# Patient Record
Sex: Male | Born: 1960 | Race: White | Hispanic: No | Marital: Married | Smoking: Never smoker
Health system: Southern US, Community
[De-identification: ages and names within clinical notes are randomized; demographics above are authoritative.]

## PROBLEM LIST (undated history)

## (undated) DIAGNOSIS — T7840XA Allergy, unspecified, initial encounter: Secondary | ICD-10-CM

## (undated) DIAGNOSIS — K219 Gastro-esophageal reflux disease without esophagitis: Secondary | ICD-10-CM

## (undated) HISTORY — DX: Gastro-esophageal reflux disease without esophagitis: K21.9

## (undated) HISTORY — DX: Allergy, unspecified, initial encounter: T78.40XA

---

## 2012-03-14 ENCOUNTER — Emergency Department (HOSPITAL_COMMUNITY): Payer: Managed Care, Other (non HMO)

## 2012-03-14 ENCOUNTER — Encounter (HOSPITAL_COMMUNITY): Payer: Self-pay | Admitting: Emergency Medicine

## 2012-03-14 ENCOUNTER — Emergency Department (HOSPITAL_COMMUNITY)
Admission: EM | Admit: 2012-03-14 | Discharge: 2012-03-14 | Disposition: A | Discharge status: Home / Self Care | Payer: Managed Care, Other (non HMO) | Attending: Emergency Medicine | Admitting: Emergency Medicine

## 2012-03-14 DIAGNOSIS — Z79899 Other long term (current) drug therapy: Secondary | ICD-10-CM | POA: Insufficient documentation

## 2012-03-14 DIAGNOSIS — M25519 Pain in unspecified shoulder: Secondary | ICD-10-CM | POA: Insufficient documentation

## 2012-03-14 DIAGNOSIS — R110 Nausea: Secondary | ICD-10-CM | POA: Insufficient documentation

## 2012-03-14 DIAGNOSIS — S0990XA Unspecified injury of head, initial encounter: Secondary | ICD-10-CM

## 2012-03-14 DIAGNOSIS — R51 Headache: Secondary | ICD-10-CM | POA: Insufficient documentation

## 2012-03-14 DIAGNOSIS — M25512 Pain in left shoulder: Secondary | ICD-10-CM

## 2012-03-14 MED ORDER — IBUPROFEN 800 MG PO TABS: 800.0000 mg | ORAL_TABLET | Freq: Three times a day (TID) | ORAL | Status: AC

## 2012-03-14 MED ORDER — KETOROLAC TROMETHAMINE 60 MG/2ML IM SOLN
60.0000 mg | Freq: Once | INTRAMUSCULAR | Status: AC
  Administered 2012-03-14: 60 mg via INTRAMUSCULAR
  Filled 2012-03-14: qty 2

## 2012-03-14 MED ORDER — HYDROCODONE-ACETAMINOPHEN 5-325 MG PO TABS
1.0000 | ORAL_TABLET | Freq: Once | ORAL | Status: AC
  Administered 2012-03-14: 1 via ORAL
  Filled 2012-03-14: qty 1

## 2012-03-14 MED ORDER — HYDROCODONE-ACETAMINOPHEN 5-325 MG PO TABS: ORAL_TABLET | ORAL | Status: AC
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
## 2012-03-14 NOTE — Progress Notes (Signed)
Orthopedic Tech Progress Note Patient Details:  Terry Wall 1961/01/24 [id number]  Ortho Devices Type of Ortho Device: Sling immobilizer Ortho Device/Splint Location: right arm Ortho Device/Splint Interventions: Application   Madalynne Gutmann 03/14/2012, 8:10 PM
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
## 2012-03-14 NOTE — ED Notes (Signed)
Paged ortho tech for right shoulder sling.

## 2012-03-14 NOTE — ED Notes (Signed)
Pt in radiology.  Will give meds when pt returns.

## 2012-03-19 ENCOUNTER — Ambulatory Visit: Payer: TRICARE For Life (TFL) | Admitting: Family Medicine

## 2012-03-19 ENCOUNTER — Encounter: Payer: Self-pay | Admitting: Family Medicine

## 2012-03-19 ENCOUNTER — Ambulatory Visit (INDEPENDENT_AMBULATORY_CARE_PROVIDER_SITE_OTHER): Payer: Managed Care, Other (non HMO) | Admitting: Family Medicine

## 2012-03-19 VITALS — BP 136/78 | HR 77 | Temp 98.1°F | Ht 74.0 in | Wt 180.0 lb

## 2012-03-19 DIAGNOSIS — S4980XA Other specified injuries of shoulder and upper arm, unspecified arm, initial encounter: Secondary | ICD-10-CM

## 2012-03-19 DIAGNOSIS — S4991XA Unspecified injury of right shoulder and upper arm, initial encounter: Secondary | ICD-10-CM

## 2012-03-19 NOTE — Patient Instructions (Addendum)
Your x-rays of your shoulder include excellent views of your shoulder blade - you do not have a fracture. You did sprain your [facility] joint (shoulder separation) and strained muscles of your shoulder blade. Given your shoulder range of motion at this time I think you will have an excellent result and improve over the next 2 weeks. Icing 3-4 times a day for 15 minutes at a time Ibuprofen 600mg  three times a day with food. Can take tylenol during day as needed in addition to this - ok to take hydrocodone at bedtime. Start scapular stabilization exercises as shown - 2 or 3 of these - do 3 sets of 10 once a day until pain has resolved. Follow up with me as needed.

## 2012-03-20 ENCOUNTER — Encounter: Payer: Self-pay | Admitting: Family Medicine

## 2012-03-20 DIAGNOSIS — S4991XA Unspecified injury of right shoulder and upper arm, initial encounter: Secondary | ICD-10-CM | POA: Insufficient documentation

## 2012-03-20 NOTE — Assessment & Plan Note (Signed)
2/2 Grade 1 or 2 shoulder separation, contusion.  Scapula appears normal as well.  Should heal well over next 1-3 weeks - already has full motion.  Icing, ibuprofen, start HEP.  Activities as tolerated.  F/u prn.
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
## 2015-05-01 ENCOUNTER — Other Ambulatory Visit (HOSPITAL_COMMUNITY): Payer: Self-pay | Admitting: Physician Assistant

## 2015-05-01 DIAGNOSIS — R079 Chest pain, unspecified: Secondary | ICD-10-CM

## 2015-05-05 ENCOUNTER — Encounter (HOSPITAL_COMMUNITY): Payer: Managed Care, Other (non HMO)

## 2015-05-08 ENCOUNTER — Ambulatory Visit: Payer: Managed Care, Other (non HMO) | Admitting: Cardiovascular Disease

## 2015-05-09 ENCOUNTER — Telehealth (HOSPITAL_COMMUNITY): Payer: Self-pay | Admitting: Radiology

## 2015-05-09 NOTE — Telephone Encounter (Signed)
Encounter complete. 

## 2015-05-10 ENCOUNTER — Telehealth (HOSPITAL_COMMUNITY): Payer: Self-pay | Admitting: Radiology

## 2015-05-10 NOTE — Telephone Encounter (Signed)
Encounter complete. 

## 2015-05-11 ENCOUNTER — Inpatient Hospital Stay (HOSPITAL_COMMUNITY)
Admission: RE | Admit: 2015-05-11 | Discharge: 2015-05-11 | Disposition: A | Discharge status: Home / Self Care | Payer: Managed Care, Other (non HMO) | Source: Ambulatory Visit

## 2015-06-09 ENCOUNTER — Telehealth (HOSPITAL_COMMUNITY): Payer: Self-pay

## 2015-06-09 NOTE — Telephone Encounter (Signed)
Encounter complete. 

## 2015-06-13 ENCOUNTER — Telehealth (HOSPITAL_COMMUNITY): Payer: Self-pay

## 2015-06-13 NOTE — Telephone Encounter (Signed)
Encounter complete. 

## 2015-06-14 ENCOUNTER — Inpatient Hospital Stay (HOSPITAL_COMMUNITY): Admission: RE | Admit: 2015-06-14 | Payer: Managed Care, Other (non HMO) | Source: Ambulatory Visit

## 2015-07-31 ENCOUNTER — Encounter: Admitting: Physician Assistant

## 2015-07-31 ENCOUNTER — Ambulatory Visit (INDEPENDENT_AMBULATORY_CARE_PROVIDER_SITE_OTHER): Payer: PRIVATE HEALTH INSURANCE

## 2015-07-31 DIAGNOSIS — R079 Chest pain, unspecified: Secondary | ICD-10-CM | POA: Diagnosis not present

## 2015-07-31 LAB — EXERCISE TOLERANCE TEST
CSEPED: 11 min
CSEPHR: 101 %
CSEPPHR: 169 {beats}/min
Estimated workload: 13.4 METS
MPHR: 167 {beats}/min
RPE: 17
Rest HR: 71 {beats}/min
